# Patient Record
Sex: Female | Born: 1990 | Race: Black or African American | Hispanic: No | Marital: Single | Smoking: Light tobacco smoker
Health system: Southern US, Community
[De-identification: ages and names within clinical notes are randomized; demographics above are authoritative.]

## PROBLEM LIST (undated history)

## (undated) DIAGNOSIS — E282 Polycystic ovarian syndrome: Secondary | ICD-10-CM

## (undated) DIAGNOSIS — R87619 Unspecified abnormal cytological findings in specimens from cervix uteri: Secondary | ICD-10-CM

## (undated) HISTORY — DX: Unspecified abnormal cytological findings in specimens from cervix uteri: R87.619

## (undated) HISTORY — PX: NO PAST SURGERIES: SHX2092

## (undated) HISTORY — DX: Polycystic ovarian syndrome: E28.2

---
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
## 2009-03-22 ENCOUNTER — Emergency Department (HOSPITAL_COMMUNITY): Admission: EM | Admit: 2009-03-22 | Discharge: 2009-03-22 | Payer: Self-pay | Admitting: Emergency Medicine

## 2011-01-21 ENCOUNTER — Emergency Department: Payer: Self-pay | Admitting: Emergency Medicine

## 2014-09-24 ENCOUNTER — Emergency Department
Admission: EM | Admit: 2014-09-24 | Discharge: 2014-09-24 | Disposition: A | Discharge status: Home / Self Care | Payer: 59 | Attending: Emergency Medicine | Admitting: Emergency Medicine

## 2014-09-24 DIAGNOSIS — Y998 Other external cause status: Secondary | ICD-10-CM | POA: Insufficient documentation

## 2014-09-24 DIAGNOSIS — Y9389 Activity, other specified: Secondary | ICD-10-CM | POA: Diagnosis not present

## 2014-09-24 DIAGNOSIS — Y9289 Other specified places as the place of occurrence of the external cause: Secondary | ICD-10-CM | POA: Diagnosis not present

## 2014-09-24 DIAGNOSIS — T63441A Toxic effect of venom of bees, accidental (unintentional), initial encounter: Secondary | ICD-10-CM | POA: Diagnosis not present

## 2014-09-24 DIAGNOSIS — Z792 Long term (current) use of antibiotics: Secondary | ICD-10-CM | POA: Insufficient documentation

## 2014-09-24 MED ORDER — SULFAMETHOXAZOLE-TRIMETHOPRIM 800-160 MG PO TABS: 1.0000 | ORAL_TABLET | Freq: Two times a day (BID) | ORAL | Status: DC

## 2014-09-24 NOTE — Discharge Instructions (Signed)

## 2014-09-24 NOTE — ED Notes (Signed)
Pt states she was stung by a wasp on the right upper arm on Saturday and since has had swelling and redness spread from the site.Marland Kitchen.denies any difficulty breathing or other sx

## 2014-09-24 NOTE — ED Notes (Signed)
Patient with no complaints at this time. Respirations even and unlabored. Skin warm/dry. Discharge instructions reviewed with patient at this time. Patient given opportunity to voice concerns/ask questions. Patient discharged at this time and left Emergency Department with steady gait.   

## 2014-09-24 NOTE — ED Provider Notes (Signed)
[facility] Emergency Department Provider Note  ____________________________________________  Time seen: Approximately 6:59 PM  I have reviewed the triage vital signs and the nursing notes.   HISTORY  Chief Complaint Insect Bite    HPI Melissa Watts is a 24 y.o. female who was stung by a wasp 3 days ago presents with complaints of right upper arm swelling and redness and warmth. A chest pain or shortness of breath. Denies any fever.   No past medical history on file.  There are no active problems to display for this patient.   No past surgical history on file.  Current Outpatient Rx  Name  Route  Sig  Dispense  Refill  . sulfamethoxazole-trimethoprim (BACTRIM DS,SEPTRA DS) 800-160 MG per tablet   Oral   Take 1 tablet by mouth 2 (two) times daily.   20 tablet   0     Allergies Review of patient's allergies indicates no known allergies.  No family history on file.  Social History History  Substance Use Topics  . Smoking status: Not on file  . Smokeless tobacco: Not on file  . Alcohol Use: Not on file    Review of Systems Constitutional: No fever/chills Eyes: No visual changes. ENT: No sore throat. Cardiovascular: Denies chest pain. Respiratory: Denies shortness of breath. Gastrointestinal: No abdominal pain.  No nausea, no vomiting.  No diarrhea.  No constipation. Genitourinary: Negative for dysuria. Musculoskeletal: Negative for back pain. Skin: Positive for rash and erythema. Right upper arm. Neurological: Negative for headaches, focal weakness or numbness.  10-point ROS otherwise negative.  ____________________________________________   PHYSICAL EXAM:  VITAL SIGNS: ED Triage Vitals  Enc Vitals Group     BP 09/24/14 1811 137/79 mmHg     Pulse Rate 09/24/14 1811 68     Resp 09/24/14 1811 16     Temp 09/24/14 1811 98.1 F (36.7 C)     Temp Source 09/24/14 1811 Oral     SpO2 09/24/14 1811 100 %     Weight 09/24/14  1811 165 lb (74.844 kg)     Height 09/24/14 1811  (1.575 m)     Head Cir --      Peak Flow --      Pain Score 09/24/14 1812 2     Pain Loc --      Pain Edu? --      Excl. in GC? --     Constitutional: Alert and oriented. Well appearing and in no acute distress. Cardiovascular: Normal rate, regular rhythm. Grossly normal heart sounds.  Good peripheral circulation. Respiratory: Normal respiratory effort.  No retractions. Lungs CTAB. Musculoskeletal: No lower extremity tenderness nor edema.  No joint effusions. Neurologic:  Normal speech and language. No gross focal neurologic deficits are appreciated. Speech is normal. No gait instability. Skin:  Skin is warm dry, erythematous right upper arm. Psychiatric: Mood and affect are normal. Speech and behavior are normal.  ____________________________________________   LABS (all labs ordered are listed, but only abnormal results are displayed)  Labs Reviewed - No data to display ____________________________________________    PROCEDURES  Procedure(s) performed: None  Critical Care performed: No  ____________________________________________   INITIAL IMPRESSION / ASSESSMENT AND PLAN / ED COURSE  Pertinent labs & imaging results that were available during my care of the patient were reviewed by me and considered in my medical decision making (see chart for details).  Cellulitis secondary to insect bite right upper arm. Rx given for him DS twice a day #20.  Encouraged Tylenol ibuprofen as needed for pain. Cold compresses every hour as needed for swelling. Patient voices no other emergency medical complaints at this time and will return to the ER with any worsening symptomology. ____________________________________________   FINAL CLINICAL IMPRESSION(S) / ED DIAGNOSES  Final diagnoses:  Bee sting reaction, accidental or unintentional, initial encounter      Evangeline Dakin, PA-C 09/25/14 0388  Maurilio Lovely,  MD 10/05/14 0025

## 2016-09-08 ENCOUNTER — Encounter: Payer: Self-pay | Admitting: Obstetrics and Gynecology

## 2016-09-08 ENCOUNTER — Ambulatory Visit (INDEPENDENT_AMBULATORY_CARE_PROVIDER_SITE_OTHER): Payer: 59 | Admitting: Obstetrics and Gynecology

## 2016-09-08 VITALS — BP 137/89 | HR 67 | Ht 62.0 in | Wt 178.2 lb

## 2016-09-08 DIAGNOSIS — N914 Secondary oligomenorrhea: Secondary | ICD-10-CM | POA: Diagnosis not present

## 2016-09-08 LAB — POCT URINE PREGNANCY: Preg Test, Ur: NEGATIVE

## 2016-09-08 NOTE — Progress Notes (Signed)
HPI:      [name] is a 26 y.o. G0P0000 who LMP was Patient's last menstrual period was 07/11/2016 (exact date).  Subjective:   She presents today Complaining of irregular menstrual periods. She was on OCPs and after discontinuing them had a few regular cycles and then her cycles became irregular. She often skips 2-3 months between them. She has had irregular cycles like this most of her life when not on OCPs. At one point in her life she had an ultrasound and she was told she might have PCOS.    Hx: The following portions of the patient's history were reviewed and updated as appropriate:             She  has no past medical history on file. She  does not have a problem list on file. She  has no past surgical history on file. Her family history includes Diabetes in her maternal grandfather and mother. She  reports that she has been smoking.  She has never used smokeless tobacco. She reports that she drinks alcohol. She reports that she does not use drugs. She has No Known Allergies.       Review of Systems:  Review of Systems  Constitutional: Denied constitutional symptoms, night sweats, recent illness, fatigue, fever, insomnia and weight loss.  Eyes: Denied eye symptoms, eye pain, photophobia, vision change and visual disturbance.  Ears/Nose/Throat/Neck: Denied ear, nose, throat or neck symptoms, hearing loss, nasal discharge, sinus congestion and sore throat.  Cardiovascular: Denied cardiovascular symptoms, arrhythmia, chest pain/pressure, edema, exercise intolerance, orthopnea and palpitations.  Respiratory: Denied pulmonary symptoms, asthma, pleuritic pain, productive sputum, cough, dyspnea and wheezing.  Gastrointestinal: Denied, gastro-esophageal reflux, melena, nausea and vomiting.  Genitourinary: See HPI for additional information.  Musculoskeletal: Denied musculoskeletal symptoms, stiffness, swelling, muscle weakness and myalgia.  Dermatologic: Denied dermatology  symptoms, rash and scar.  Neurologic: Denied neurology symptoms, dizziness, headache, neck pain and syncope.  Psychiatric: Denied psychiatric symptoms, anxiety and depression.  Endocrine: Denied endocrine symptoms including hot flashes and night sweats.   Meds:   No current outpatient prescriptions on file prior to visit.   No current facility-administered medications on file prior to visit.     Objective:     Vitals:   09/08/16 1503  BP: 137/89  Pulse: 67                Assessment:    G0P0000 There are no active problems to display for this patient.    1. Secondary oligomenorrhea     Likely anovulatory. Likely PCO.   Plan:            1.  PCO labs  2.  We'll discuss labs when they return and decide upon management at that time. Patient does not desire immediate fertility. Orders Orders Placed This Encounter  Procedures  . DHEA-sulfate  . FSH/LH  . TSH  . Testosterone, Free, Total, SHBG  . Prolactin  . Glucose, random  . Insulin, Fasting  . POCT urine pregnancy    No orders of the defined types were placed in this encounter.       F/U  No Follow-up on file. I spent 33 minutes with this patient of which greater than 50% was spent discussing oligomenorrhea, anovulation, PCO, future fertility, treatment of PCO.  Elonda Huskyavid J. Maily Debarge, M.D. 09/08/2016 4:08 PM

## 2016-09-15 ENCOUNTER — Other Ambulatory Visit: Payer: 59

## 2016-09-16 LAB — DHEA-SULFATE: DHEA-SO4: 555.8 ug/dL — ABNORMAL HIGH (ref 84.8–378.0)

## 2016-09-16 LAB — GLUCOSE, RANDOM: Glucose: 89 mg/dL (ref 65–99)

## 2016-09-16 LAB — FSH/LH
FSH: 8.7 m[IU]/mL
LH: 28 m[IU]/mL

## 2016-09-16 LAB — TESTOSTERONE, FREE, TOTAL, SHBG
SEX HORMONE BINDING: 26.6 nmol/L (ref 24.6–122.0)
TESTOSTERONE FREE: 6.2 pg/mL — AB (ref 0.0–4.2)
TESTOSTERONE: 82 ng/dL — AB (ref 8–48)

## 2016-09-16 LAB — TSH: TSH: 2.38 u[IU]/mL (ref 0.450–4.500)

## 2016-09-16 LAB — PROLACTIN: Prolactin: 24.3 ng/mL — ABNORMAL HIGH (ref 4.8–23.3)

## 2016-09-29 ENCOUNTER — Telehealth: Payer: Self-pay

## 2016-09-29 NOTE — Telephone Encounter (Signed)
-----   Message from Linzie Collinavid James Evans, MD sent at 09/28/2016  5:11 PM EDT ----- Based on these results, please have her schedule a visit with me.

## 2016-09-29 NOTE — Telephone Encounter (Signed)
Appointment scheduled per provider.

## 2016-10-05 ENCOUNTER — Encounter: Payer: Self-pay | Admitting: Obstetrics and Gynecology

## 2016-10-05 ENCOUNTER — Ambulatory Visit (INDEPENDENT_AMBULATORY_CARE_PROVIDER_SITE_OTHER): Payer: 59 | Admitting: Obstetrics and Gynecology

## 2016-10-05 VITALS — BP 123/91 | HR 92 | Ht 62.0 in | Wt 176.6 lb

## 2016-10-05 DIAGNOSIS — N914 Secondary oligomenorrhea: Secondary | ICD-10-CM

## 2016-10-05 DIAGNOSIS — E282 Polycystic ovarian syndrome: Secondary | ICD-10-CM

## 2016-10-05 LAB — POCT URINE PREGNANCY: Preg Test, Ur: NEGATIVE

## 2016-10-05 MED ORDER — MEDROXYPROGESTERONE ACETATE 10 MG PO TABS: 10.0000 mg | ORAL_TABLET | Freq: Every day | ORAL | 0 refills | Status: DC

## 2016-10-05 MED ORDER — DESOGESTREL-ETHINYL ESTRADIOL 0.15-0.02/0.01 MG (21/5) PO TABS: 1.0000 | ORAL_TABLET | Freq: Every day | ORAL | 2 refills | Status: DC

## 2016-10-05 NOTE — Progress Notes (Signed)
HPI:      [name] is a 26 y.o. G0P0000 who LMP was Patient's last menstrual period was 07/11/2016 (exact date).  Subjective:   She presents today For follow-up of her lab work for PCO. She has not had a menstrual period since April. She is not interested in immediate fertility.    Hx: The following portions of the patient's history were reviewed and updated as appropriate:             She  has no past medical history on file. She  does not have a problem list on file. She  has no past surgical history on file. Her family history includes Diabetes in her maternal grandfather and mother. She  reports that she has been smoking.  She has never used smokeless tobacco. She reports that she drinks alcohol. She reports that she does not use drugs. She has No Known Allergies.       Review of Systems:  Review of Systems  Constitutional: Denied constitutional symptoms, night sweats, recent illness, fatigue, fever, insomnia and weight loss.  Eyes: Denied eye symptoms, eye pain, photophobia, vision change and visual disturbance.  Ears/Nose/Throat/Neck: Denied ear, nose, throat or neck symptoms, hearing loss, nasal discharge, sinus congestion and sore throat.  Cardiovascular: Denied cardiovascular symptoms, arrhythmia, chest pain/pressure, edema, exercise intolerance, orthopnea and palpitations.  Respiratory: Denied pulmonary symptoms, asthma, pleuritic pain, productive sputum, cough, dyspnea and wheezing.  Gastrointestinal: Denied, gastro-esophageal reflux, melena, nausea and vomiting.  Genitourinary: Denied genitourinary symptoms including symptomatic vaginal discharge, pelvic relaxation issues, and urinary complaints.  Musculoskeletal: Denied musculoskeletal symptoms, stiffness, swelling, muscle weakness and myalgia.  Dermatologic: Denied dermatology symptoms, rash and scar.  Neurologic: Denied neurology symptoms, dizziness, headache, neck pain and syncope.  Psychiatric: Denied  psychiatric symptoms, anxiety and depression.  Endocrine: Denied endocrine symptoms including hot flashes and night sweats.   Meds:   No current outpatient prescriptions on file prior to visit.   No current facility-administered medications on file prior to visit.     Objective:     Vitals:   10/05/16 0803  BP: (!) 123/91  Pulse: 92              PCO labs reviewed one by one directly with the patient.  Urinary pregnancy test negative  Assessment:    G0P0000 There are no active problems to display for this patient.    1. Secondary oligomenorrhea   2. PCO (polycystic ovaries)     With multiple endocrine system involvement.   Plan:            1.  We have discussed PCO in detail. Methods of treatment, future fertility, lifelong nature of this condition and increased risk for endometrial hyperplasia heart disease obesity discussed in detail. All questions answered.  2.  Patient would like to start OCPs. - Plan withdrawal bleed followed by initiation of OCPs.   OCPs The risks /benefits of OCPs have been explained to the patient in detail.  Product literature has been given to her.  I have instructed her in the use of OCPs and have given her literature reinforcing this information.  I have explained to the patient that OCPs are not as effective for birth control during the first month of use, and that another form of contraception should be used during this time.  Both first-day start and Sunday start have been explained.  The risks and benefits of each was discussed.  She has been made aware of  the fact  that other medications may affect the efficacy of OCPs.  I have answered all of her questions, and I believe that she has an understanding of the effectiveness and use of OCPs.   3.  Follow-up fasting morning prolactin in 6 weeks  4.  Annual examination in 3 months.  Orders Orders Placed This Encounter  Procedures  . Prolactin  . POCT urine pregnancy     Meds ordered this  encounter  Medications  . medroxyPROGESTERone (PROVERA) 10 MG tablet    Sig: Take 1 tablet (10 mg total) by mouth daily. Use for ten days    Dispense:  5 tablet    Refill:  0  . desogestrel-ethinyl estradiol (MIRCETTE) 0.15-0.02/0.01 MG (21/5) tablet    Sig: Take 1 tablet by mouth at bedtime.    Dispense:  1 Package    Refill:  2        F/U  Return in about 3 months (around 01/05/2017) for Annual Physical. I spent 26 minutes with this patient of which greater than 50% was spent discussing PCO-current treatments and future issues and fertility. Use of OCPs. Lab values for PCO.  (See above)  Melissa Watts, M.D. 10/05/2016 9:56 AM

## 2016-10-16 ENCOUNTER — Encounter: Payer: Self-pay | Admitting: Obstetrics and Gynecology

## 2016-11-16 ENCOUNTER — Other Ambulatory Visit: Payer: 59

## 2017-01-05 ENCOUNTER — Encounter: Payer: 59 | Admitting: Obstetrics and Gynecology

## 2017-01-05 ENCOUNTER — Other Ambulatory Visit: Payer: 59

## 2017-01-06 ENCOUNTER — Telehealth: Payer: Self-pay | Admitting: Obstetrics and Gynecology

## 2017-01-06 NOTE — Telephone Encounter (Signed)
Patient needs a script for a 90 day supply of her BC pills faxed to [facility] now - they will not allow her to get a 30 supply any longer  She will call back with the information

## 2017-01-23 ENCOUNTER — Emergency Department
Admission: EM | Admit: 2017-01-23 | Discharge: 2017-01-23 | Disposition: A | Discharge status: Home / Self Care | Payer: 59 | Attending: Emergency Medicine | Admitting: Emergency Medicine

## 2017-01-23 ENCOUNTER — Encounter: Payer: Self-pay | Admitting: Emergency Medicine

## 2017-01-23 ENCOUNTER — Emergency Department: Payer: 59

## 2017-01-23 DIAGNOSIS — J029 Acute pharyngitis, unspecified: Secondary | ICD-10-CM | POA: Diagnosis present

## 2017-01-23 DIAGNOSIS — Z79899 Other long term (current) drug therapy: Secondary | ICD-10-CM | POA: Insufficient documentation

## 2017-01-23 DIAGNOSIS — J069 Acute upper respiratory infection, unspecified: Secondary | ICD-10-CM | POA: Insufficient documentation

## 2017-01-23 DIAGNOSIS — F17210 Nicotine dependence, cigarettes, uncomplicated: Secondary | ICD-10-CM | POA: Diagnosis not present

## 2017-01-23 DIAGNOSIS — M791 Myalgia, unspecified site: Secondary | ICD-10-CM | POA: Insufficient documentation

## 2017-01-23 LAB — POCT RAPID STREP A: STREPTOCOCCUS, GROUP A SCREEN (DIRECT): NEGATIVE

## 2017-01-23 MED ORDER — BENZONATATE 100 MG PO CAPS: 100.0000 mg | ORAL_CAPSULE | Freq: Three times a day (TID) | ORAL | 0 refills | Status: AC | PRN

## 2017-01-23 MED ORDER — CETIRIZINE HCL 10 MG PO TABS: 10.0000 mg | ORAL_TABLET | Freq: Every day | ORAL | 1 refills | Status: DC

## 2017-01-23 NOTE — ED Triage Notes (Signed)
Pt presents to eD with body aches, productive cough, chills, and sore throat since Friday. Unsure of fever at home. Symptoms worsened today. No increased work of breathing noted at this time.

## 2017-01-23 NOTE — ED Provider Notes (Signed)
[facility] Emergency Department Provider Note  ____________________________________________  Time seen: Approximately 8:11 PM  I have reviewed the triage vital signs and the nursing notes.   HISTORY  Chief Complaint Generalized Body Aches; Cough; and Sore Throat    HPI Melissa Watts is a 26 y.o. female presenting to the emergency department with rhinorrhea, congestion, nonproductive cough and low-grade fever for the past 2 days.  Patient is tolerating fluids and food by mouth with no major changes in stooling or urinary habits.  No recent travel.  No alleviating measures have been attempted.   History reviewed. No pertinent past medical history.  There are no active problems to display for this patient.   History reviewed. No pertinent surgical history.  Prior to Admission medications   Medication Sig Start Date End Date Taking? Authorizing Provider  benzonatate (TESSALON PERLES) 100 MG capsule Take 1 capsule (100 mg total) by mouth 3 (three) times daily as needed for cough. 01/23/17 01/30/17  Orvil Feil, PA-C  cetirizine (ZYRTEC ALLERGY) 10 MG tablet Take 1 tablet (10 mg total) by mouth daily. 01/23/17 02/02/17  Orvil Feil, PA-C  desogestrel-ethinyl estradiol (MIRCETTE) 0.15-0.02/0.01 MG (21/5) tablet Take 1 tablet by mouth at bedtime. 10/05/16   Linzie Collin, MD  medroxyPROGESTERone (PROVERA) 10 MG tablet Take 1 tablet (10 mg total) by mouth daily. Use for ten days 10/05/16   Linzie Collin, MD    Allergies Patient has no known allergies.  Family History  Problem Relation Age of Onset  . Diabetes Mother   . Diabetes Maternal Grandfather     Social History Social History  Substance Use Topics  . Smoking status: Light Tobacco Smoker  . Smokeless tobacco: Never Used  . Alcohol use Yes     Comment: occas     Review of Systems  Constitutional: Patient has fever.  Eyes: No visual changes. No discharge ENT: Patient has  congestion.  Cardiovascular: no chest pain. Respiratory: Patient has cough.  Gastrointestinal: No abdominal pain.  No nausea, no vomiting. Patient had diarrhea.  Genitourinary: Negative for dysuria. No hematuria Musculoskeletal: Patient has myalgias.  Skin: Negative for rash, abrasions, lacerations, ecchymosis. Neurological: Patient has not had headache, no focal weakness or numbness.    ____________________________________________   PHYSICAL EXAM:  VITAL SIGNS: ED Triage Vitals [01/23/17 1936]  Enc Vitals Group     BP 137/87     Pulse Rate 93     Resp 20     Temp 99.2 F (37.3 C)     Temp Source Oral     SpO2 96 %     Weight 170 lb (77.1 kg)     Height 5\' 2"  (1.575 m)     Head Circumference      Peak Flow      Pain Score 5     Pain Loc      Pain Edu?      Excl. in GC?      Constitutional: Alert and oriented. Patient is lying supine. Eyes: Conjunctivae are normal. PERRL. EOMI. Head: Atraumatic. ENT:      Ears: Tympanic membranes are mildly injected with mild effusion bilaterally.       Nose: No congestion/rhinnorhea.      Mouth/Throat: Mucous membranes are moist. Posterior pharynx is mildly erythematous.  Hematological/Lymphatic/Immunilogical: No cervical lymphadenopathy.  Cardiovascular: Normal rate, regular rhythm. Normal S1 and S2.  Good peripheral circulation. Respiratory: Normal respiratory effort without tachypnea or retractions. Lungs CTAB. Good air entry to  the bases with no decreased or absent breath sounds. Gastrointestinal: Bowel sounds 4 quadrants. Soft and nontender to palpation. No guarding or rigidity. No palpable masses. No distention. No CVA tenderness. Musculoskeletal: Full range of motion to all extremities. No gross deformities appreciated. Neurologic:  Normal speech and language. No gross focal neurologic deficits are appreciated.  Skin:  Skin is warm, dry and intact. No rash noted. Psychiatric: Mood and affect are normal. Speech and behavior  are normal. Patient exhibits appropriate insight and judgement.  ____________________________________________   LABS (all labs ordered are listed, but only abnormal results are displayed)  Labs Reviewed  POCT RAPID STREP A  POC URINE PREG, ED   ____________________________________________  EKG   ____________________________________________  RADIOLOGY Geraldo PitterI, Melisse Caetano M Lumina Gitto, personally viewed and evaluated these images (plain radiographs) as part of my medical decision making, as well as reviewing the written report by the radiologist.    Dg Chest 2 View  Result Date: 01/23/2017 CLINICAL DATA:  Body aches with productive cough, chills and sore throat 2 days. EXAM: CHEST  2 VIEW COMPARISON:  None. FINDINGS: Lungs are clear. Cardiomediastinal silhouette, bones and soft tissues are normal. IMPRESSION: No active cardiopulmonary disease. Electronically Signed   By: Elberta Fortisaniel  Boyle M.D.   On: 01/23/2017 20:16    ____________________________________________    PROCEDURES  Procedure(s) performed:    Procedures    Medications - No data to display   ____________________________________________   INITIAL IMPRESSION / ASSESSMENT AND PLAN / ED COURSE  Pertinent labs & imaging results that were available during my care of the patient were reviewed by me and considered in my medical decision making (see chart for details).  Review of the [place] CSRS was performed in accordance of the [facility] prior to dispensing any controlled drugs.     Assessment and plan Viral URI Differential diagnosis includes seasonal allergies versus viral upper respiratory tract infection.  History is more consistent with a viral URI.  Patient was discharged with Zyrtec and Tessalon Perles for rhinorrhea and nonproductive cough.  Patient was wise to use Tylenol and ibuprofen alternating for fever.  Rest and hydration were encouraged.  A work note was provided.  All patient questions were  answered. ____________________________________________  FINAL CLINICAL IMPRESSION(S) / ED DIAGNOSES  Final diagnoses:  Viral upper respiratory tract infection      NEW MEDICATIONS STARTED DURING THIS VISIT:  New Prescriptions   BENZONATATE (TESSALON PERLES) 100 MG CAPSULE    Take 1 capsule (100 mg total) by mouth 3 (three) times daily as needed for cough.   CETIRIZINE (ZYRTEC ALLERGY) 10 MG TABLET    Take 1 tablet (10 mg total) by mouth daily.        This chart was dictated using voice recognition software/Dragon. Despite best efforts to proofread, errors can occur which can change the meaning. Any change was purely unintentional.    Orvil FeilWoods, Lael Wetherbee M, PA-C 01/23/17 2045    Phineas SemenGoodman, [place], MD 01/24/17 [phone]

## 2017-01-23 NOTE — ED Notes (Signed)
Pt reports started on Wednesday with sore throat and then developed cough and congestion with body aches. Pt unsure if she had a fever. Resp even and unlabored, lungs clear bilaterally.

## 2017-02-04 ENCOUNTER — Other Ambulatory Visit: Payer: 59

## 2017-02-04 ENCOUNTER — Encounter: Payer: 59 | Admitting: Obstetrics and Gynecology

## 2017-02-23 ENCOUNTER — Telehealth: Payer: Self-pay

## 2017-02-23 ENCOUNTER — Telehealth: Payer: Self-pay | Admitting: Obstetrics and Gynecology

## 2017-02-23 DIAGNOSIS — E282 Polycystic ovarian syndrome: Secondary | ICD-10-CM

## 2017-02-23 MED ORDER — DESOGESTREL-ETHINYL ESTRADIOL 0.15-0.02/0.01 MG (21/5) PO TABS: 1.0000 | ORAL_TABLET | Freq: Every day | ORAL | 0 refills | Status: DC

## 2017-02-23 NOTE — Telephone Encounter (Signed)
Patient called stating she needs a refill on birth control. She states it needs to be sent to her mail order pharmacy for a 90 day supply. [facility] fax # [phone]. Thanks

## 2017-02-23 NOTE — Telephone Encounter (Signed)
Pt was scheduled for an annual exam at the end of Oct that she cancelled. [facility] [place]. Pt states she will call tomorrow to schedule another annual exam appointment.

## 2017-04-13 ENCOUNTER — Encounter: Payer: 59 | Admitting: Obstetrics and Gynecology

## 2017-04-30 ENCOUNTER — Ambulatory Visit (INDEPENDENT_AMBULATORY_CARE_PROVIDER_SITE_OTHER): Payer: 59 | Admitting: Obstetrics and Gynecology

## 2017-04-30 ENCOUNTER — Encounter: Payer: Self-pay | Admitting: Obstetrics and Gynecology

## 2017-04-30 VITALS — BP 112/72 | HR 71 | Ht 62.0 in | Wt 174.6 lb

## 2017-04-30 DIAGNOSIS — Z01419 Encounter for gynecological examination (general) (routine) without abnormal findings: Secondary | ICD-10-CM

## 2017-04-30 DIAGNOSIS — R7989 Other specified abnormal findings of blood chemistry: Secondary | ICD-10-CM

## 2017-04-30 DIAGNOSIS — E229 Hyperfunction of pituitary gland, unspecified: Secondary | ICD-10-CM

## 2017-04-30 MED ORDER — DESOGESTREL-ETHINYL ESTRADIOL 0.15-0.02/0.01 MG (21/5) PO TABS: 1.0000 | ORAL_TABLET | Freq: Every day | ORAL | 3 refills | Status: DC

## 2017-04-30 NOTE — Progress Notes (Signed)
HPI:      [name] is a 27 y.o. G0P0000 who LMP was Patient's last menstrual period was 04/30/2017 (exact date).  Subjective:   She presents today for her annual examination.  She has been having normal menstrual periods on OCPs.  She would like to continue OCPs. She missed her appointment for prolactin-will order today.  Significant history includes history of PCO.  Patient not currently interested in fertility at this time.    Hx: The following portions of the patient's history were reviewed and updated as appropriate:             She  has a past medical history of Abnormal Pap smear of cervix and PCOS (polycystic ovarian syndrome). She does not have a problem list on file. She  has a past surgical history that includes No past surgeries. Her family history includes Diabetes in her maternal grandfather and mother. She  reports that she has been smoking cigars.  She has been smoking about 0.25 packs per day. she has never used smokeless tobacco. She reports that she drinks alcohol. She reports that she does not use drugs. She has No Known Allergies.       Review of Systems:  Review of Systems  Constitutional: Denied constitutional symptoms, night sweats, recent illness, fatigue, fever, insomnia and weight loss.  Eyes: Denied eye symptoms, eye pain, photophobia, vision change and visual disturbance.  Ears/Nose/Throat/Neck: Denied ear, nose, throat or neck symptoms, hearing loss, nasal discharge, sinus congestion and sore throat.  Cardiovascular: Denied cardiovascular symptoms, arrhythmia, chest pain/pressure, edema, exercise intolerance, orthopnea and palpitations.  Respiratory: Denied pulmonary symptoms, asthma, pleuritic pain, productive sputum, cough, dyspnea and wheezing.  Gastrointestinal: Denied, gastro-esophageal reflux, melena, nausea and vomiting.  Genitourinary: Denied genitourinary symptoms including symptomatic vaginal discharge, pelvic relaxation issues, and urinary  complaints.  Musculoskeletal: Denied musculoskeletal symptoms, stiffness, swelling, muscle weakness and myalgia.  Dermatologic: Denied dermatology symptoms, rash and scar.  Neurologic: Denied neurology symptoms, dizziness, headache, neck pain and syncope.  Psychiatric: Denied psychiatric symptoms, anxiety and depression.  Endocrine: Denied endocrine symptoms including hot flashes and night sweats.   Meds:   No current outpatient medications on file prior to visit.   No current facility-administered medications on file prior to visit.     Objective:     Vitals:   04/30/17 0812  BP: 112/72  Pulse: 71              Physical examination General NAD, Conversant  HEENT Atraumatic; Op clear with mmm.  Normo-cephalic. Pupils reactive. Anicteric sclerae  Thyroid/Neck Smooth without nodularity or enlargement. Normal ROM.  Neck Supple.  Skin No rashes, lesions or ulceration. Normal palpated skin turgor. No nodularity.  Breasts: No masses or discharge.  Symmetric.  No axillary adenopathy.  Lungs: Clear to auscultation.No rales or wheezes. Normal Respiratory effort, no retractions.  Heart: NSR.  No murmurs or rubs appreciated. No periferal edema  Abdomen: Soft.  Non-tender.  No masses.  No HSM. No hernia  Extremities: Moves all appropriately.  Normal ROM for age. No lymphadenopathy.  Neuro: Oriented to PPT.  Normal mood. Normal affect.     Pelvic:   Vulva: Normal appearance.  No lesions.  Vagina: No lesions or abnormalities noted.  Support: Normal pelvic support.  Urethra No masses tenderness or scarring.  Meatus Normal size without lesions or prolapse.  Cervix: Normal appearance.  No lesions.  Anus: Normal exam.  No lesions.  Perineum: Normal exam.  No lesions.  Bimanual   Uterus: Normal size.  Non-tender.  Mobile.  AV.  Adnexae: No masses.  Non-tender to palpation.  Cul-de-sac: Negative for abnormality.      Assessment:    G0P0000 There are no active problems to  display for this patient.    1. Elevated prolactin level (HCC)   2. Well woman exam with routine gynecological exam     Patient doing well on OCPs for her PCO.  She would like to continue OCPs.   Plan:            1.  Basic Screening Recommendations The basic screening recommendations for asymptomatic women were discussed with the patient during her visit.  The age-appropriate recommendations were discussed with her and the rational for the tests reviewed.  When I am informed by the patient that another primary care physician has previously obtained the age-appropriate tests and they are up-to-date, only outstanding tests are ordered and referrals given as necessary.  Abnormal results of tests will be discussed with her when all of her results are completed. Pap performed 2.  Continue Mircette 3.  Fasting morning prolactin today. Orders Orders Placed This Encounter  Procedures  . Prolactin     Meds ordered this encounter  Medications  . desogestrel-ethinyl estradiol (KARIVA,AZURETTE,MIRCETTE) 0.15-0.02/0.01 MG (21/5) tablet    Sig: Take 1 tablet by mouth daily.    Dispense:  3 Package    Refill:  3        F/U  Return in about 1 year (around 04/30/2018) for Annual Physical.  Elonda Husky, M.D. 04/30/2017 9:21 AM

## 2017-05-01 LAB — PROLACTIN: Prolactin: 17.4 ng/mL (ref 4.8–23.3)

## 2017-05-03 LAB — PAP IG W/ RFLX HPV ASCU: PAP Smear Comment: 0

## 2018-05-05 ENCOUNTER — Encounter: Payer: 59 | Admitting: Obstetrics and Gynecology

## 2018-05-12 NOTE — Patient Instructions (Signed)
Breast Self-Awareness  Breast self-awareness means:  · Knowing how your breasts look.  · Knowing how your breasts feel.  · Checking your breasts every month for changes.  · Telling your doctor if you notice a change in your breasts.  Breast self-awareness allows you to notice a breast problem early while it is still small.  How to do a breast self-exam  One way to learn what is normal for your breasts and to check for changes is to do a breast self-exam. To do a breast self-exam:  Look for Changes    1. Take off all the clothes above your waist.  2. Stand in front of a mirror in a room with good lighting.  3. Put your hands on your hips.  4. Push your hands down.  5. Look at your breasts and nipples in the mirror to see if one breast or nipple looks different than the other. Check to see if:  ? The shape of one breast is different.  ? The size of one breast is different.  ? There are wrinkles, dips, and bumps in one breast and not the other.  6. Look at each breast for changes in your skin, such as:  ? Redness.  ? Scaly areas.  7. Look for changes in your nipples, such as:  ? Liquid around the nipples.  ? Bleeding.  ? Dimpling.  ? Redness.  ? A change in where the nipples are.  Feel for Changes  1. Lie on your back on the floor.  2. Feel each breast. To do this, follow these steps:  ? Pick a breast to feel.  ? Put the arm closest to that breast above your head.  ? Use your other arm to feel the nipple area of your breast. Feel the area with the pads of your three middle fingers by making small circles with your fingers. For the first circle, press lightly. For the second circle, press harder. For the third circle, press even harder.  ? Keep making circles with your fingers at the light, harder, and even harder pressures as you move down your breast. Stop when you feel your ribs.  ? Move your fingers a little toward the center of your body.  ? Start making circles with your fingers again, this time going up until you  reach your collarbone.  ? Keep making up and down circles until you reach your armpit. Remember to keep using the three pressures.  ? Feel the other breast in the same way.  3. Sit or stand in the shower or tub.  4. With soapy water on your skin, feel each breast the same way you did in step 2, when you were lying on the floor.  Write Down What You Find  After doing the self-exam, write down:  · What is normal for each breast.  · Any changes you find in each breast.  · When you last had your period.    How often should I check my breasts?  Check your breasts every month. If you are breastfeeding, the best time to check them is after you feed your baby or after you use a breast pump. If you get periods, the best time to check your breasts is 5-7 days after your period is over.  When should I see my doctor?  See your doctor if you notice:  · A change in shape or size of your breasts or nipples.  · A change in the skin   of your breast or nipples, such as red or scaly skin.  · Unusual fluid coming from your nipples.  · A lump or thick area that was not there before.  · Pain in your breasts.  · Anything that concerns you.  This information is not intended to replace advice given to you by your health care provider. Make sure you discuss any questions you have with your health care provider.  Document Released: 09/02/2007 Document Revised: 08/22/2015 Document Reviewed: 02/03/2015  Elsevier Interactive Patient Education © 2019 [employer]

## 2018-05-13 ENCOUNTER — Other Ambulatory Visit: Payer: Self-pay | Admitting: Obstetrics and Gynecology

## 2018-05-13 ENCOUNTER — Ambulatory Visit (INDEPENDENT_AMBULATORY_CARE_PROVIDER_SITE_OTHER): Payer: 59 | Admitting: Obstetrics and Gynecology

## 2018-05-13 ENCOUNTER — Encounter: Payer: Self-pay | Admitting: Obstetrics and Gynecology

## 2018-05-13 VITALS — BP 134/86 | HR 70 | Ht 62.0 in | Wt 158.3 lb

## 2018-05-13 DIAGNOSIS — Z0000 Encounter for general adult medical examination without abnormal findings: Secondary | ICD-10-CM | POA: Diagnosis not present

## 2018-05-13 DIAGNOSIS — Z01419 Encounter for gynecological examination (general) (routine) without abnormal findings: Secondary | ICD-10-CM | POA: Diagnosis not present

## 2018-05-13 MED ORDER — DESOGESTREL-ETHINYL ESTRADIOL 0.15-0.02/0.01 MG (21/5) PO TABS: 1.0000 | ORAL_TABLET | Freq: Every day | ORAL | 3 refills | Status: DC

## 2018-05-13 NOTE — Progress Notes (Signed)
Pt is present today for annual exam. Pt last pap was done 04/30/17 results being normal. Pt stated that she stopped taking her BCP to see if her cycles would be regular without them. Pt stated that her LMP was 03/06/18. UPT done results negative. Pt wants to start back on BCP. Pt stated that she needs a refill of the BCP. Pt stated that she do self-breast exam some months, but noticed a cyst under her right breast x 6 days and the area hurt when touched. Pt declined flu vaccine. Pt denies any issues with itching, burning or discharge in the vaginal area. No other problems or concerns.

## 2018-05-13 NOTE — Progress Notes (Signed)
HPI:      Melissa Watts is a 28 y.o. G0P0000 who LMP was Patient's last menstrual period was 03/06/2018.  Subjective:   She presents today for her annual examination.  She stopped OCPs to "see if she would cycle without them".  She found out that her periods have become irregular and she would like to restart them.  She has no immediate plans for fertility. Complains of a small irritation/boil underneath her right breast.    Hx: The following portions of the patient's history were reviewed and updated as appropriate:             She  has a past medical history of Abnormal Pap smear of cervix and PCOS (polycystic ovarian syndrome). She does not have a problem list on file. She  has a past surgical history that includes No past surgeries. Her family history includes Diabetes in her maternal grandfather and mother. She  reports that she has been smoking cigars. She has been smoking about 0.25 packs per day. She has never used smokeless tobacco. She reports current alcohol use. She reports that she does not use drugs. She has a current medication list which includes the following prescription(s): desogestrel-ethinyl estradiol. She has No Known Allergies.       Review of Systems:  Review of Systems  Constitutional: Denied constitutional symptoms, night sweats, recent illness, fatigue, fever, insomnia and weight loss.  Eyes: Denied eye symptoms, eye pain, photophobia, vision change and visual disturbance.  Ears/Nose/Throat/Neck: Denied ear, nose, throat or neck symptoms, hearing loss, nasal discharge, sinus congestion and sore throat.  Cardiovascular: Denied cardiovascular symptoms, arrhythmia, chest pain/pressure, edema, exercise intolerance, orthopnea and palpitations.  Respiratory: Denied pulmonary symptoms, asthma, pleuritic pain, productive sputum, cough, dyspnea and wheezing.  Gastrointestinal: Denied, gastro-esophageal reflux, melena, nausea and vomiting.  Genitourinary: Denied  genitourinary symptoms including symptomatic vaginal discharge, pelvic relaxation issues, and urinary complaints.  Musculoskeletal: Denied musculoskeletal symptoms, stiffness, swelling, muscle weakness and myalgia.  Dermatologic: Denied dermatology symptoms, rash and scar.  Neurologic: Denied neurology symptoms, dizziness, headache, neck pain and syncope.  Psychiatric: Denied psychiatric symptoms, anxiety and depression.  Endocrine: Denied endocrine symptoms including hot flashes and night sweats.   Meds:   Current Outpatient Medications on File Prior to Visit  Medication Sig Dispense Refill  . desogestrel-ethinyl estradiol (KARIVA,AZURETTE,MIRCETTE) 0.15-0.02/0.01 MG (21/5) tablet Take 1 tablet by mouth daily. 3 Package 3   No current facility-administered medications on file prior to visit.     Objective:     Vitals:   05/13/18 0846  BP: 134/86  Pulse: 70              Physical examination General NAD, Conversant  HEENT Atraumatic; Op clear with mmm.  Normo-cephalic. Pupils reactive. Anicteric sclerae  Thyroid/Neck Smooth without nodularity or enlargement. Normal ROM.  Neck Supple.  Skin No rashes, lesions or ulceration. Normal palpated skin turgor. No nodularity.  Breasts: No masses or discharge.  Symmetric.  No axillary adenopathy.  Small boil underneath right breast.  Minimally tender.  Lungs: Clear to auscultation.No rales or wheezes. Normal Respiratory effort, no retractions.  Heart: NSR.  No murmurs or rubs appreciated. No periferal edema  Abdomen: Soft.  Non-tender.  No masses.  No HSM. No hernia  Extremities: Moves all appropriately.  Normal ROM for age. No lymphadenopathy.  Neuro: Oriented to PPT.  Normal mood. Normal affect.     Pelvic:   Vulva: Normal appearance.  No lesions.  Vagina: No lesions or abnormalities noted.  Support: Normal pelvic support.  Urethra No masses tenderness or scarring.  Meatus Normal size without lesions or prolapse.  Cervix: Normal  appearance.  No lesions.  Anus: Normal exam.  No lesions.  Perineum: Normal exam.  No lesions.        Bimanual   Uterus: Normal size.  Non-tender.  Mobile.  AV.  Adnexae: No masses.  Non-tender to palpation.  Cul-de-sac: Negative for abnormality.      Assessment:    G0P0000 There are no active problems to display for this patient.    1. Encounter for well woman exam with routine gynecological exam   2. Encounter for annual physical exam     History of PCO-patient would like to start OCPs again because her periods have become irregular.   Plan:            1.  Basic Screening Recommendations The basic screening recommendations for asymptomatic women were discussed with the patient during her visit.  The age-appropriate recommendations were discussed with her and the rational for the tests reviewed.  When I am informed by the patient that another primary care physician has previously obtained the age-appropriate tests and they are up-to-date, only outstanding tests are ordered and referrals given as necessary.  Abnormal results of tests will be discussed with her when all of her results are completed. 2.  Restart OCPs Orders No orders of the defined types were placed in this encounter.   No orders of the defined types were placed in this encounter.       F/U  Return in about 1 year (around 05/14/2019) for Annual Physical.  Elonda Husky, M.D. 05/13/2018 9:18 AM

## 2018-07-15 IMAGING — CR DG CHEST 2V
1 series · 2 of 2 positions shown · non-contrast
Comparison: None.

CLINICAL DATA: Body aches with productive cough, chills and sore
throat 2 days.

EXAM:
CHEST  2 VIEW

[Series 1: dg chest 2 view · 0.14mm/px · 2 of 2 slices shown]
[im 1/2]
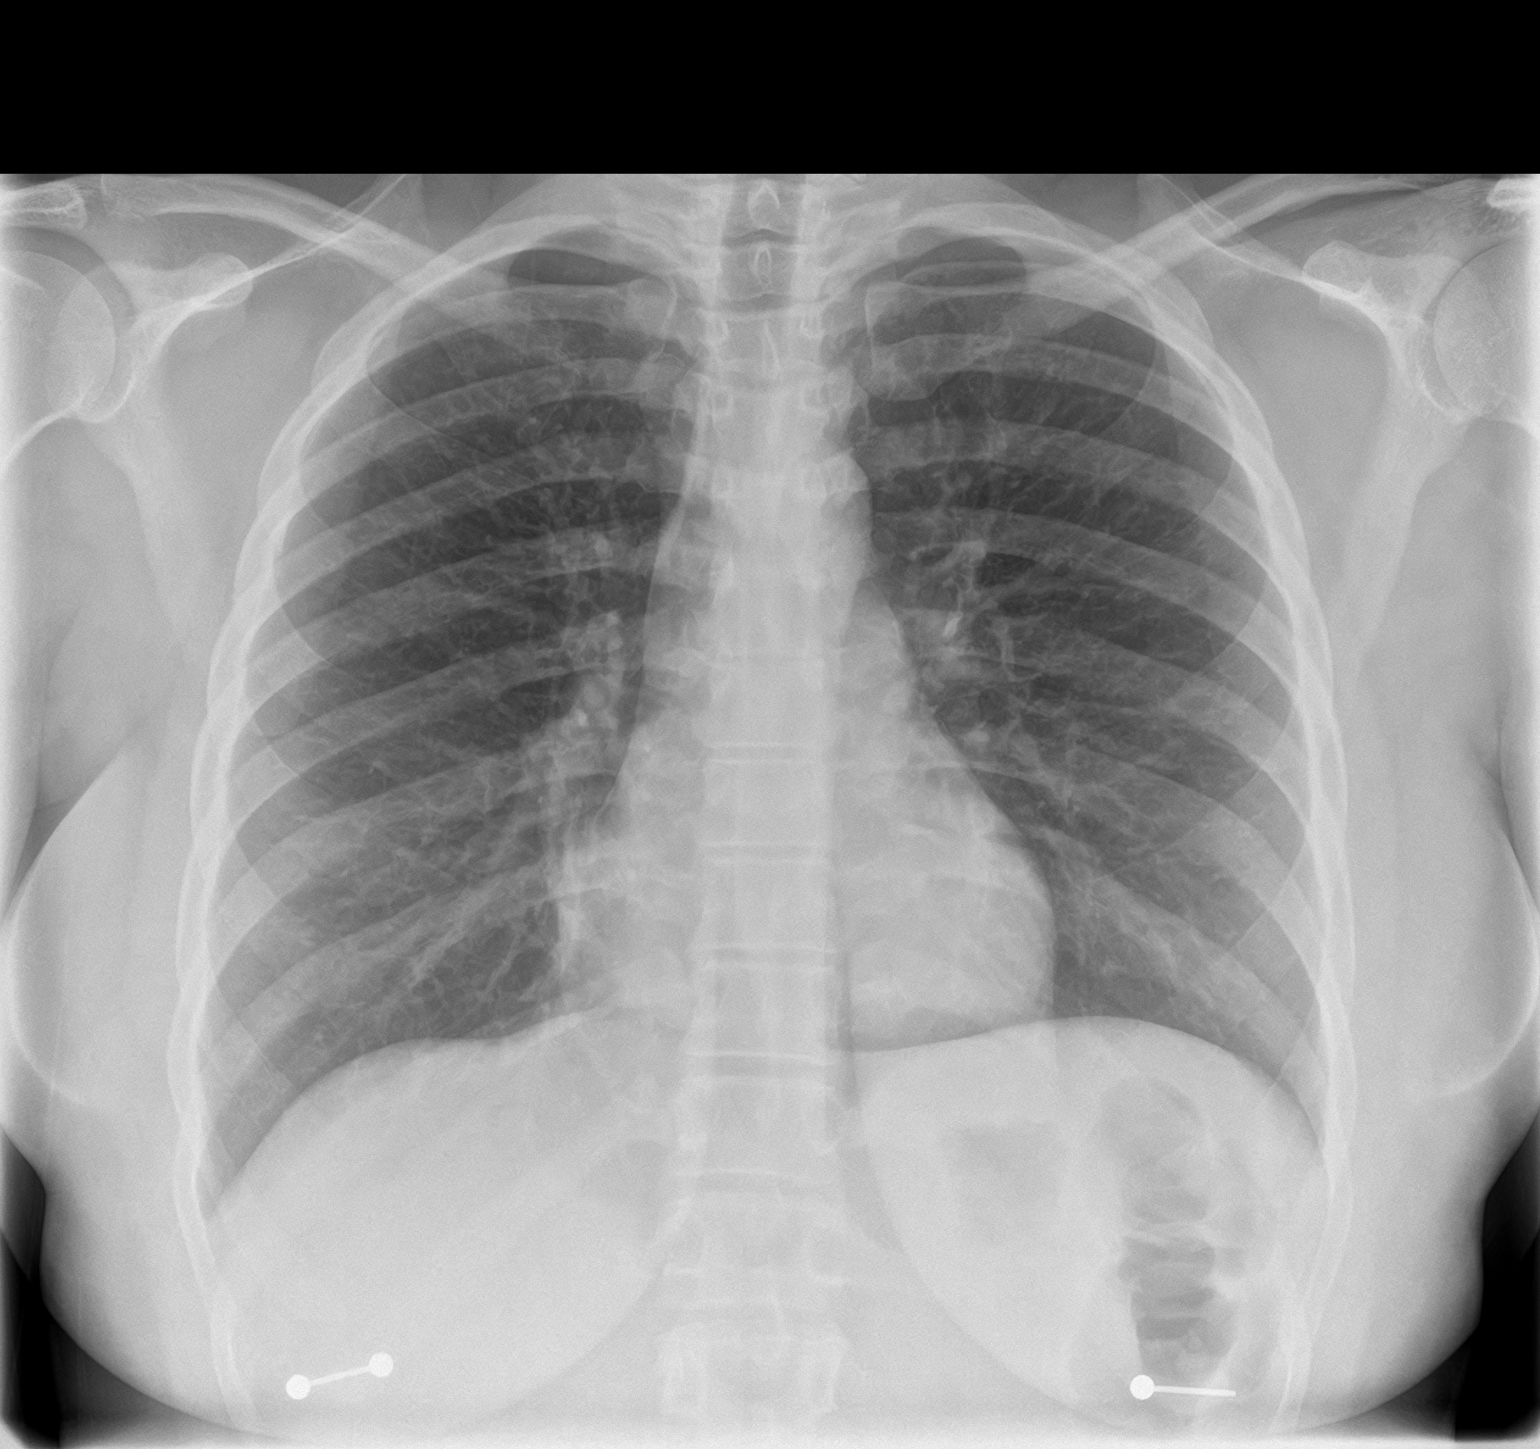
[im 2/2]
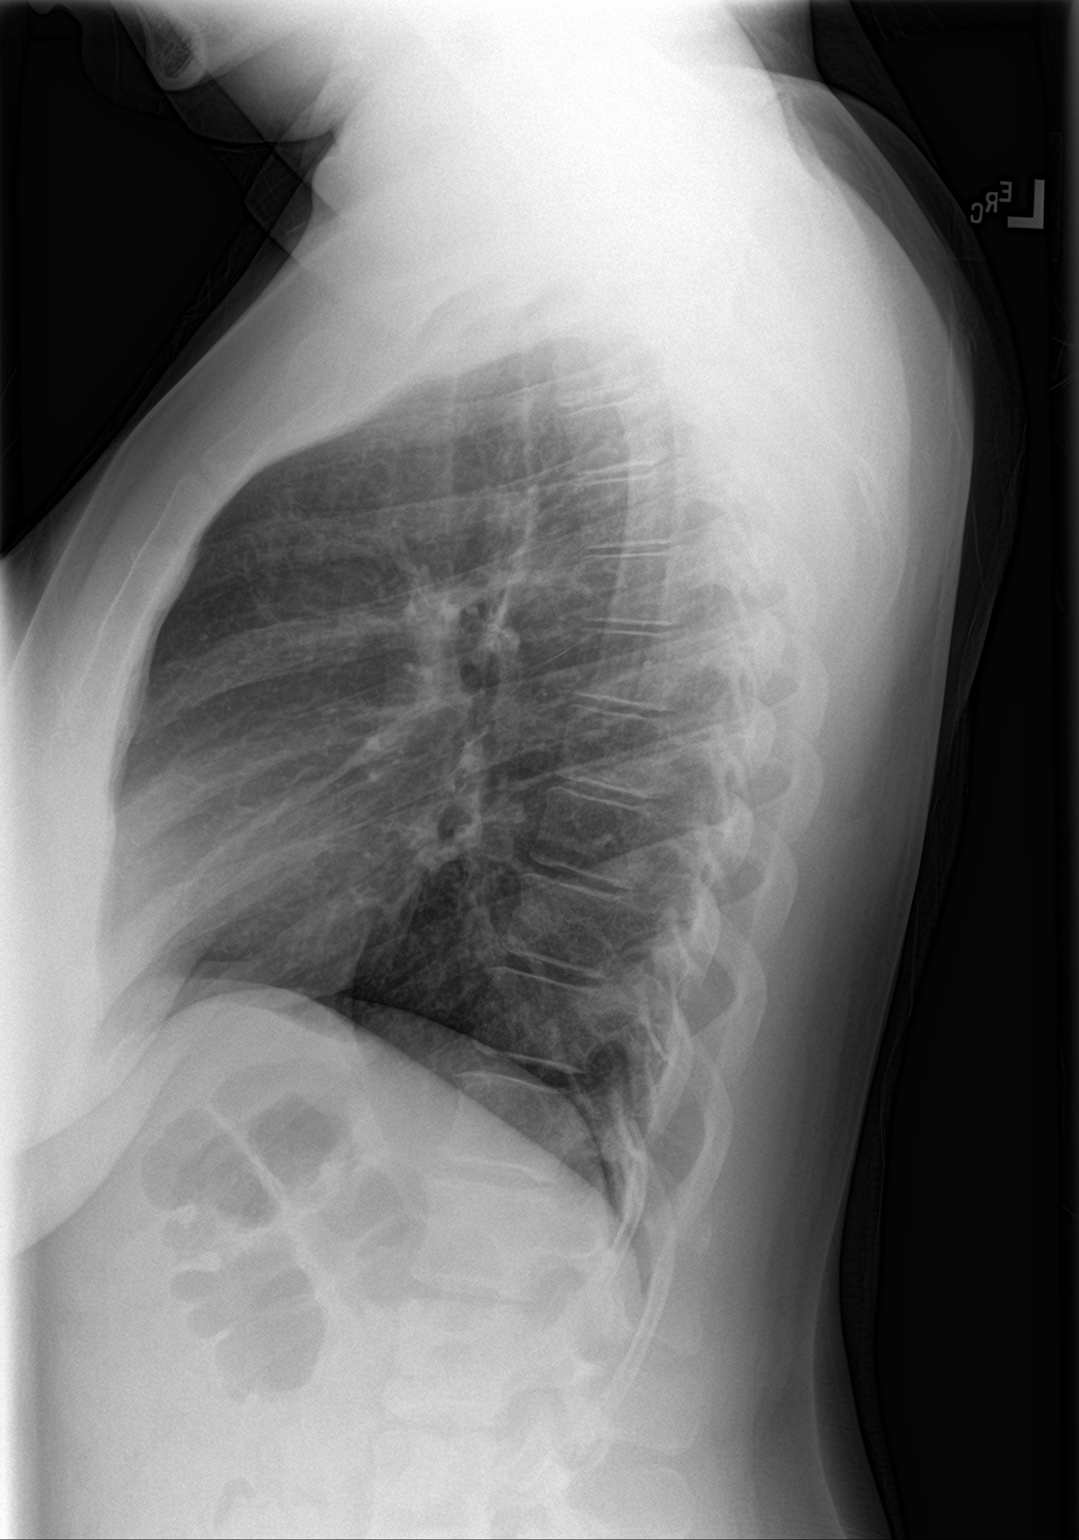

[2 of 2 positions shown; findings below may reference images not displayed]

FINDINGS: Lungs are clear. Cardiomediastinal silhouette, bones and soft
tissues are normal.
IMPRESSION: No active cardiopulmonary disease.

## 2018-10-20 ENCOUNTER — Telehealth: Payer: Self-pay | Admitting: Obstetrics and Gynecology

## 2018-10-20 NOTE — Telephone Encounter (Signed)
The patient called requesting to speak with a nurse if possible in regards to her thinking she may have a UTI. Please advise.

## 2018-10-21 NOTE — Telephone Encounter (Signed)
Pt was advised to increase her water intake, cranberry juice and try AZO tablets. Pt stated having some pain and small amount of bleeding when she urinated. Pt was advised that if her symptoms did not decrease by Tuesday or increased an anyway to contact the office and make an appointment to be seen. Pt voiced that she understood.

## 2018-10-27 ENCOUNTER — Telehealth: Payer: Self-pay | Admitting: Obstetrics and Gynecology

## 2018-10-27 NOTE — Telephone Encounter (Signed)
The patient called and stated that she is having UTI symptoms and side pain. As I was scheduling her appointment the pt disclosed that she had a fever of 101 last night 10/26/18. I then informed the patient that we will not be able to schedule an appointment until 14 days out from her last symptom. Informed pt a message will be sent back to her nurse and she will receive a call back. Please advise.

## 2018-10-27 NOTE — Telephone Encounter (Signed)
Patient is going to an urgent care to be seen. Dr, Amalia Hailey doesn't have any openings today.

## 2018-10-28 ENCOUNTER — Other Ambulatory Visit: Payer: Self-pay

## 2018-10-28 ENCOUNTER — Ambulatory Visit
Admission: EM | Admit: 2018-10-28 | Discharge: 2018-10-28 | Disposition: A | Discharge status: Home / Self Care | Payer: 59 | Attending: Urgent Care | Admitting: Urgent Care

## 2018-10-28 DIAGNOSIS — N3001 Acute cystitis with hematuria: Secondary | ICD-10-CM

## 2018-10-28 LAB — URINALYSIS, COMPLETE (UACMP) WITH MICROSCOPIC
Bilirubin Urine: NEGATIVE
Glucose, UA: NEGATIVE mg/dL
Ketones, ur: NEGATIVE mg/dL
Nitrite: NEGATIVE
Protein, ur: 30 mg/dL — AB
Specific Gravity, Urine: 1.015 (ref 1.005–1.030)
Squamous Epithelial / LPF: NONE SEEN (ref 0–5)
WBC, UA: >50 WBC/hpf (ref 0–5)
pH: 6 (ref 5.0–8.0)

## 2018-10-28 MED ORDER — PHENAZOPYRIDINE HCL 200 MG PO TABS: 200.0000 mg | ORAL_TABLET | Freq: Three times a day (TID) | ORAL | 0 refills | Status: DC | PRN

## 2018-10-28 MED ORDER — NITROFURANTOIN MONOHYD MACRO 100 MG PO CAPS: 100.0000 mg | ORAL_CAPSULE | Freq: Two times a day (BID) | ORAL | 0 refills | Status: DC

## 2018-10-28 NOTE — ED Provider Notes (Signed)
[place]   Name: Melissa Watts DOB: January 20, 1991 MRN: [id number] CSN: [id number] PCP: Patient, No Pcp Per  Arrival date and time:  10/28/18 0857  Chief Complaint:  Urinary Frequency   NOTE: Prior to seeing the patient today, I have reviewed the triage nursing documentation and vital signs. Clinical staff has updated patient's PMH/PSHx, current medication list, and drug allergies/intolerances to ensure comprehensive history available to assist in medical decision making.   History:   HPI: Melissa Watts is a 28 y.o. female who presents today with complaints of urinary symptoms that began with acute onset about 1 week ago. She complains of dysuria, frequency, and urgency. She appreciated some mild gross hematuria when her symptoms started, which has since resolved. She she has not noticed her urine being malodorous. Patient denies any associated nausea or vomiting. She has had fever (Tmax 100.3) for the last 2 days; no chills. She complains of pain in the RIGHT side of her lower back. She denies flank and abdominal pain. Patient advises that she does not have a significant history for recurrent urinary tract infections. She denies any vaginal pain, bleeding, or discharge. LMP was on approximately 10/10/2018; no concerns that she is currently pregnant.   Past Medical History:  Diagnosis Date  . Abnormal Pap smear of cervix   . PCOS (polycystic ovarian syndrome)     Past Surgical History:  Procedure Laterality Date  . NO PAST SURGERIES      Family History  Problem Relation Age of Onset  . Diabetes Mother   . Diabetes Maternal Grandfather   . Breast cancer Neg Hx   . Ovarian cancer Neg Hx   . Colon cancer Neg Hx     Social History   Tobacco Use  . Smoking status: Light Tobacco Smoker    Packs/day: 0.25    Types: Cigars  . Smokeless tobacco: Never Used  Substance Use Topics  . Alcohol use: Yes    Comment: occas  . Drug use: No    There are no active problems to  display for this patient.   Home Medications:    No outpatient medications have been marked as taking for the 10/28/18 encounter [facility](Hospital Encounter).    Allergies:   Patient has no known allergies.  Review of Systems (ROS): Review of Systems  Constitutional: Negative for chills and fever.  Respiratory: Negative for cough and shortness of breath.   Cardiovascular: Negative for chest pain and palpitations.  Gastrointestinal: Negative for abdominal pain, diarrhea, nausea and vomiting.  Genitourinary: Positive for dysuria, flank pain, hematuria and urgency. Negative for vaginal bleeding, vaginal discharge and vaginal pain.  Musculoskeletal: Positive for back pain. Negative for myalgias.  Skin: Negative for color change, pallor and rash.  Neurological: Negative for seizures, syncope, facial asymmetry, weakness and headaches.  All other systems reviewed and are negative.    Vital Signs: Today's Vitals   10/28/18 0908 10/28/18 0909 10/28/18 0910  BP:   (!) 122/96  Pulse: 96    Resp: 18    Temp: 98.7 F (37.1 C)    TempSrc: Oral    SpO2: 100%    Weight:  165 lb (74.8 kg)   Height:  5\' 2"  (1.575 m)   PainSc:  0-No pain     Physical Exam: Physical Exam  Constitutional: She is oriented to person, place, and time and well-developed, well-nourished, and in no distress.  HENT:  Head: Normocephalic and atraumatic.  Mouth/Throat: Mucous membranes are normal.  Eyes: Pupils are equal,  round, and reactive to light. EOM are normal.  Cardiovascular: Normal rate, regular rhythm, normal heart sounds and intact distal pulses. Exam reveals no gallop and no friction rub.  No murmur heard. Pulmonary/Chest: Effort normal and breath sounds normal. No respiratory distress. She has no wheezes. She has no rales.  Abdominal: Soft. Normal appearance and bowel sounds are normal. There is abdominal tenderness in the suprapubic area. There is CVA tenderness (slight RIGHT).  Neurological: She is alert  and oriented to person, place, and time. Gait normal. GCS score is 15.  Skin: Skin is warm and dry. No rash noted.  Psychiatric: Mood, memory, affect and judgment normal.  Nursing note and vitals reviewed.   Urgent Care Treatments / Results:   LABS: PLEASE NOTE: all labs that were ordered this encounter are listed, however only abnormal results are displayed. Labs Reviewed  URINALYSIS, COMPLETE (UACMP) WITH MICROSCOPIC - Abnormal; Notable for the following components:      Result Value   APPearance HAZY (*)    Hgb urine dipstick MODERATE (*)    Protein, ur 30 (*)    Leukocytes,Ua LARGE (*)    Bacteria, UA MANY (*)    All other components within normal limits  URINE CULTURE    EKG: -None  RADIOLOGY: No results found.  PROCEDURES: Procedures  MEDICATIONS RECEIVED THIS VISIT: Medications - No data to display  PERTINENT CLINICAL COURSE NOTES/UPDATES:   Initial Impression / Assessment and Plan / Urgent Care Course:  Pertinent labs & imaging results that were available during my care of the patient were personally reviewed by me and considered in my medical decision making (see lab/imaging section of note for values and interpretations).  Melissa Watts is a 28 y.o. female [facility] with complaints of Urinary Frequency   Patient is well appearing overall in clinic today. She does not appear to be in any acute distress. Presenting symptoms (see HPI) and exam as documented above. Marland Kitchen.UA was (+) for infection; reflex culture sent. Will treat with a 5 day course of Nitrofurantoin. Patient encouraged to complete the entire course of antibiotics even if she begins to feel better. She was advised that if culture demonstrates resistance to the prescribed antibiotic, she will be contacted and advised of the need to change the antibiotic being used to treat her infection. Patient encouraged to increase her fluid intake as much as possible. Discussed that water  is always best to flush the urinary tract. She was advised to avoid caffeine containing fluids until her infections clears, as caffeine can cause her to experience painful bladder spasms. May use Tylenol and/or Ibuprofen as needed for pain/fever. Will also provider course of phenazopyridine to help relieve her current urinary pain.   Discussed follow up with primary care physician or GYN in 1 week for re-evaluation. I have reviewed the follow up and strict return precautions for any new or worsening symptoms. Patient is aware of symptoms that would be deemed urgent/emergent, and would thus require further evaluation either here or in the emergency department. At the time of discharge, she verbalized understanding and consent with the discharge plan as it was reviewed with her. All questions were fielded by provider and/or clinic staff prior to patient discharge.    Final Clinical Impressions / Urgent Care Diagnoses:   Final diagnoses:  Acute cystitis with hematuria    New Prescriptions:  [place] Controlled Substance Registry consulted? Not Applicable  Meds ordered this encounter  Medications  . nitrofurantoin, macrocrystal-monohydrate, (  MACROBID) 100 MG capsule    Sig: Take 1 capsule (100 mg total) by mouth 2 (two) times daily.    Dispense:  10 capsule    Refill:  0  . phenazopyridine (PYRIDIUM) 200 MG tablet    Sig: Take 1 tablet (200 mg total) by mouth 3 (three) times daily as needed for pain.    Dispense:  9 tablet    Refill:  0    Recommended Follow up Care:  Patient encouraged to follow up with the following provider within the specified time frame, or sooner as dictated by the severity of her symptoms. As always, she was instructed that for any urgent/emergent care needs, she should seek care either here or in the emergency department for more immediate evaluation.  Follow-up Information    PCP In 1 week.   Why: General reassessment of symptoms if not improving        NOTE: This  note was prepared using Lobbyist along with smaller Company secretary. Despite my best ability to proofread, there is the potential that transcriptional errors may still occur from this process, and are completely unintentional.     Karen Kitchens, NP 10/28/18 0943

## 2018-10-28 NOTE — Discharge Instructions (Signed)
It was very nice seeing you today in clinic. Thank you for entrusting me with your care.  ° °As discussed, your urine is POSITIVE for infection. Will approach treatment as follows: ° °Prescription has been sent to your pharmacy for antibiotics.  °Please pick up and take as directed. FINISH the entire course of medication even if you are feeling better.  °A culture will be sent on your provided sample. If it comes back resistant to what I have prescribed you, someone will call you and let you know that we will need to change antibiotics. °Increase fluid intake as much as possible to flush your urinary tract.  °Water is always the best.  °Avoid caffeine until your infection clears up, as it can contribute to painful bladder spasms.  °May use Tylenol and/or Ibuprofen as needed for pain/fever. ° °Make arrangements to follow up with your regular doctor in 1 week for re-evaluation. If your symptoms/condition worsens, please seek follow up care either here or in the ER. Please remember, our [place] providers are "right here with you" when you need us.  ° °Again, it was my pleasure to take care of you today. Thank you for choosing our clinic. I hope that you start to feel better quickly.  ° °Petronella Shuford, MSN, APRN, FNP-C, CEN °Advanced Practice Provider °[facility] ° °

## 2018-10-28 NOTE — ED Triage Notes (Signed)
Pt c/o increased urinary frequency with irritation for the past week and states the last 2 nights had a temp 100.3, normal today. Some flank pain

## 2018-10-30 LAB — URINE CULTURE: Culture: 80000 — AB

## 2018-10-31 ENCOUNTER — Telehealth (HOSPITAL_COMMUNITY): Payer: Self-pay | Admitting: Emergency Medicine

## 2018-10-31 NOTE — Telephone Encounter (Signed)
Urine culture was positive for  E coli and was given macrobid  at urgent care visit. Attempted to reach patient. No answer at this time

## 2019-06-08 ENCOUNTER — Other Ambulatory Visit: Payer: Self-pay | Admitting: Obstetrics and Gynecology

## 2019-06-08 ENCOUNTER — Telehealth: Payer: Self-pay | Admitting: Surgical

## 2019-06-08 DIAGNOSIS — Z0000 Encounter for general adult medical examination without abnormal findings: Secondary | ICD-10-CM

## 2019-06-08 NOTE — Telephone Encounter (Signed)
LM on patient voicemail to call and schedule for her annual exam. I have sent in a 30 supply to get her through.

## 2019-06-13 ENCOUNTER — Other Ambulatory Visit: Payer: Self-pay

## 2019-06-13 ENCOUNTER — Encounter: Payer: Self-pay | Admitting: Obstetrics and Gynecology

## 2019-06-13 ENCOUNTER — Ambulatory Visit (INDEPENDENT_AMBULATORY_CARE_PROVIDER_SITE_OTHER): Payer: 59 | Admitting: Obstetrics and Gynecology

## 2019-06-13 VITALS — BP 147/83 | HR 76 | Ht 62.0 in | Wt 157.8 lb

## 2019-06-13 DIAGNOSIS — Z0000 Encounter for general adult medical examination without abnormal findings: Secondary | ICD-10-CM | POA: Diagnosis not present

## 2019-06-13 DIAGNOSIS — Z8742 Personal history of other diseases of the female genital tract: Secondary | ICD-10-CM | POA: Diagnosis not present

## 2019-06-13 DIAGNOSIS — N912 Amenorrhea, unspecified: Secondary | ICD-10-CM

## 2019-06-13 LAB — POCT URINE PREGNANCY: Preg Test, Ur: NEGATIVE

## 2019-06-13 MED ORDER — DESOGESTREL-ETHINYL ESTRADIOL 0.15-0.02/0.01 MG (21/5) PO TABS: 1.0000 | ORAL_TABLET | Freq: Every day | ORAL | 11 refills | Status: AC

## 2019-06-13 NOTE — Addendum Note (Signed)
Addended by: Dorian Pod on: 06/13/2019 02:48 PM   Modules accepted: Orders

## 2019-06-13 NOTE — Progress Notes (Signed)
HPI:      [name] is a 29 y.o. G0P0000 who LMP was Patient's last menstrual period was 05/19/2019.  Subjective:   She presents today for her annual examination.  She has a history of PCO and is currently taking OCPs.  She does have normal regular cycles on OCPs.  She is considering fertility in the near future but has not yet decided upon when she will try to get pregnant.    Hx: The following portions of the patient's history were reviewed and updated as appropriate:             She  has a past medical history of Abnormal Pap smear of cervix and PCOS (polycystic ovarian syndrome). She does not have a problem list on file. She  has a past surgical history that includes No past surgeries. Her family history includes Diabetes in her maternal grandfather and mother. She  reports that she has been smoking cigars. She has been smoking about 0.25 packs per day. She has never used smokeless tobacco. She reports current alcohol use. She reports that she does not use drugs. She has a current medication list which includes the following prescription(s): desogestrel-ethinyl estradiol. She has No Known Allergies.       Review of Systems:  Review of Systems  Constitutional: Denied constitutional symptoms, night sweats, recent illness, fatigue, fever, insomnia and weight loss.  Eyes: Denied eye symptoms, eye pain, photophobia, vision change and visual disturbance.  Ears/Nose/Throat/Neck: Denied ear, nose, throat or neck symptoms, hearing loss, nasal discharge, sinus congestion and sore throat.  Cardiovascular: Denied cardiovascular symptoms, arrhythmia, chest pain/pressure, edema, exercise intolerance, orthopnea and palpitations.  Respiratory: Denied pulmonary symptoms, asthma, pleuritic pain, productive sputum, cough, dyspnea and wheezing.  Gastrointestinal: Denied, gastro-esophageal reflux, melena, nausea and vomiting.  Genitourinary: Denied genitourinary symptoms including symptomatic  vaginal discharge, pelvic relaxation issues, and urinary complaints.  Musculoskeletal: Denied musculoskeletal symptoms, stiffness, swelling, muscle weakness and myalgia.  Dermatologic: Denied dermatology symptoms, rash and scar.  Neurologic: Denied neurology symptoms, dizziness, headache, neck pain and syncope.  Psychiatric: Denied psychiatric symptoms, anxiety and depression.  Endocrine: Denied endocrine symptoms including hot flashes and night sweats.   Meds:   No current outpatient medications on file prior to visit.   No current facility-administered medications on file prior to visit.    Objective:     Vitals:   06/13/19 1338  BP: (!) 147/83  Pulse: 76              Physical examination General NAD, Conversant  HEENT Atraumatic; Op clear with mmm.  Normo-cephalic. Pupils reactive. Anicteric sclerae  Thyroid/Neck Smooth without nodularity or enlargement. Normal ROM.  Neck Supple.  Skin No rashes, lesions or ulceration. Normal palpated skin turgor. No nodularity.  Breasts: No masses or discharge.  Symmetric.  No axillary adenopathy.  Lungs: Clear to auscultation.No rales or wheezes. Normal Respiratory effort, no retractions.  Heart: NSR.  No murmurs or rubs appreciated. No periferal edema  Abdomen: Soft.  Non-tender.  No masses.  No HSM. No hernia  Extremities: Moves all appropriately.  Normal ROM for age. No lymphadenopathy.  Neuro: Oriented to PPT.  Normal mood. Normal affect.     Pelvic:   Vulva: Normal appearance.  No lesions.  Vagina: No lesions or abnormalities noted.  Support: Normal pelvic support.  Urethra No masses tenderness or scarring.  Meatus Normal size without lesions or prolapse.  Cervix: Normal appearance.  No lesions.  Anus: Normal exam.  No lesions.  Perineum: Normal exam.  No lesions.        Bimanual   Uterus: Normal size.  Non-tender.  Mobile.  AV.  Adnexae: No masses.  Non-tender to palpation.  Cul-de-sac: Negative for abnormality.       Assessment:    G0P0000 There are no problems to display for this patient.    1. Encounter for annual physical exam   2. History of PCOS     Currently cycling regularly on OCPs.   Plan:            1.  Basic Screening Recommendations The basic screening recommendations for asymptomatic women were discussed with the patient during her visit.  The age-appropriate recommendations were discussed with her and the rational for the tests reviewed.  When I am informed by the patient that another primary care physician has previously obtained the age-appropriate tests and they are up-to-date, only outstanding tests are ordered and referrals given as necessary.  Abnormal results of tests will be discussed with her when all of her results are completed.  Routine preventative health maintenance measures emphasized: Exercise/Diet/Weight control, Tobacco Warnings, Alcohol/Substance use risks and Stress Management Pap with cotesting next year 2.  Continue OCPs  Orders Orders Placed This Encounter  Procedures  . Hemoglobin A1c  . Lipid panel  . TSH     Meds ordered this encounter  Medications  . desogestrel-ethinyl estradiol (AZURETTE) 0.15-0.02/0.01 MG (21/5) tablet    Sig: Take 1 tablet by mouth daily.    Dispense:  28 tablet    Refill:  11        F/U  Return in about 1 year (around 06/12/2020) for Annual Physical.  Finis Bud, M.D. 06/13/2019 1:58 PM

## 2019-08-09 ENCOUNTER — Ambulatory Visit: Payer: 59 | Attending: Internal Medicine

## 2019-08-09 DIAGNOSIS — Z23 Encounter for immunization: Secondary | ICD-10-CM

## 2019-08-09 NOTE — Progress Notes (Signed)
   Covid-19 Vaccination Clinic  Name:  Melissa Watts    MRN: [id number] DOB: 08-25-1990  08/09/2019  Melissa Watts was observed post Covid-19 immunization for 15 minutes without incident. She was provided with Vaccine Information Sheet and instruction to access the V-Safe system.   Melissa Watts was instructed to call 911 with any severe reactions post vaccine: Marland Kitchen Difficulty breathing  . Swelling of face and throat  . A fast heartbeat  . A bad rash all over body  . Dizziness and weakness   Immunizations Administered    Name Date Dose VIS Date Route   Moderna COVID-19 Vaccine 08/09/2019  4:16 PM 0.5 mL 02/2019 Intramuscular   Manufacturer: Moderna   Lot: [id number]   NDC: 81859-093-11

## 2019-09-06 ENCOUNTER — Ambulatory Visit: Payer: 59

## 2019-09-06 DIAGNOSIS — Z23 Encounter for immunization: Secondary | ICD-10-CM

## 2019-09-06 NOTE — Progress Notes (Signed)
   Covid-19 Vaccination Clinic  Name:  Jiya Kissinger    MRN: [id number] DOB: 03-19-1991  09/06/2019  Ms. Seaborn was observed post Covid-19 immunization for 15 minutes without incident. She was provided with Vaccine Information Sheet and instruction to access the V-Safe system.   Ms. Foody was instructed to call 911 with any severe reactions post vaccine: Marland Kitchen Difficulty breathing  . Swelling of face and throat  . A fast heartbeat  . A bad rash all over body  . Dizziness and weakness   Immunizations Administered    Name Date Dose VIS Date Route   Moderna COVID-19 Vaccine 09/06/2019  3:28 PM 0.5 mL 02/2019 Intramuscular   Manufacturer: Moderna   Lot: [id number]   NDC: 24199-144-45
